# Patient Record
Sex: Female | Born: 1965 | Race: Black or African American | Hispanic: No | Marital: Married | Smoking: Never smoker
Health system: Southern US, Community
[De-identification: ages and names within clinical notes are randomized; demographics above are authoritative.]

---

## 2002-10-12 ENCOUNTER — Encounter (INDEPENDENT_AMBULATORY_CARE_PROVIDER_SITE_OTHER): Payer: Self-pay | Admitting: *Deleted

## 2002-10-12 ENCOUNTER — Ambulatory Visit (HOSPITAL_COMMUNITY): Admission: RE | Admit: 2002-10-12 | Discharge: 2002-10-13 | Payer: Self-pay | Admitting: Otolaryngology

## 2009-07-30 ENCOUNTER — Emergency Department (HOSPITAL_BASED_OUTPATIENT_CLINIC_OR_DEPARTMENT_OTHER): Admission: EM | Admit: 2009-07-30 | Discharge: 2009-07-30 | Payer: Self-pay | Admitting: Emergency Medicine

## 2011-02-20 NOTE — Op Note (Signed)
NAME:  Rachel Navarro, Rachel Navarro NO.:  [id number]   MEDICAL RECORD NO.:  [id number]                   PATIENT TYPE:  OIB   LOCATION:  2550                                 FACILITY:  [facility]   PHYSICIAN:  Suzanna Obey, M.D.                    DATE OF BIRTH:  Jan 08, 1966   DATE OF PROCEDURE:  10/12/2002  DATE OF DISCHARGE:                                 OPERATIVE REPORT   PREOPERATIVE DIAGNOSES:  1. Obstructive sleep apnea.  2. Deviated septum.  3. Turbinate hypertrophy.   POSTOPERATIVE DIAGNOSES:  1. Obstructive sleep apnea.  2. Deviated septum.  3. Turbinate hypertrophy.   SURGICAL PROCEDURE:  1. Septoplasty.  2. Submucosal resection of inferior turbinates.  3. Uvulopalatopharyngoplasty.  4. Tonsillectomy.   ANESTHESIA:  General endotracheal tube.   ESTIMATED BLOOD LOSS:  Approximately 10 cubic centimeters.   INDICATIONS:  This is a 45 year old who has had significant obstructive  sleep apnea and sleep apnea symptoms.  She has had some nasal obstruction.  She has had a sleep study which documented this and failed CPAP.  She now  wants surgery.  She was informed of the risks and benefits of the procedure  including bleeding, infection, perforation, change in external appearance of  the nose, chronic crusting and drying, numbness of the teeth, velopharyngeal  insufficiency, change in the voice, chronic pain, and risk of the  anesthetic.  All questions were answered and consent was obtained.   OPERATION:  The patient was taken to the operating room and placed in the  supine position and padded.  General endotracheal tube was placed in the  supine position.  Prepped and draped in the usual sterile manner.  Oxymetazoline pledgets were placed into the nose bilaterally and then the  septum was injected with 1% lidocaine with 1:100,000 epinephrine as well as  the inferior turbinates.  A right hemitransfixion incision was performed,  raising a  mucoperichondrial and ostial flap.  The cartilage was divided  about 1 cm posterior to the caudal strut and the posterior aspect of the  cartilage was removed with a Therapist, nutritional.  The opposite flap was  elevated.  Jansen-Middleton forceps were used to remove the deviated portion  of the bone, and the inferior spur was removed with a 4-mm osteotome.  This  corrected the central deflection.  Inferior turbinates were in-fractured and  midline incision made with a 15 blade.  Mucosal flap elevated superiorly,  and the inferior mucosa and bone were removed with the turbinate scissors.  Edges cauterized with suction cautery.  The flap was laid back down over the  raw surface and both turbinates were out-fractured.  Telfa roll soaked in  bacitracin was then placed into the nose after closing the incision with a 4-  0 chromic and a 4-0 plain-gut quilting stitch through the septum.  The  packing was secured with  3-0 nylon.  The table was then turned, and Jonna Coup  mouthgag was inserted, retracted and suspended from the Mayo stand.  The  incision was made just above the uvula and carried into the tonsillary fossa  on the left side.  The tonsil capsule was identified and removed with  electrocautery dissection.  The dissection was carried across the palate,  removing the uvula and a small portion of the palate into the right  tonsillar fossa, where the tonsil was removed again with electrocautery  dissection.  The tonsils and uvula were all removed in one en bloc piece.  Suction cautery was used obtain hemostasis.  The tonsillar fossae and palate  were closed with interrupted 3-0 Vicryl.  The Jonna Coup was released and re-  suspended and hemostasis was present in all locations.  The area was  irrigated.  The patient was then awakened and brought to recovery in stable  condition.  Counts were correct.                                               Suzanna Obey, M.D.    [name]  D:  10/12/2002  T:   10/12/2002  Job:  [id number]

## 2016-05-20 ENCOUNTER — Encounter (HOSPITAL_BASED_OUTPATIENT_CLINIC_OR_DEPARTMENT_OTHER): Payer: Self-pay | Admitting: Emergency Medicine

## 2016-05-20 ENCOUNTER — Emergency Department (HOSPITAL_BASED_OUTPATIENT_CLINIC_OR_DEPARTMENT_OTHER)
Admission: EM | Admit: 2016-05-20 | Discharge: 2016-05-20 | Disposition: A | Discharge status: Home / Self Care | Payer: Self-pay | Attending: Emergency Medicine | Admitting: Emergency Medicine

## 2016-05-20 DIAGNOSIS — S80862A Insect bite (nonvenomous), left lower leg, initial encounter: Secondary | ICD-10-CM

## 2016-05-20 DIAGNOSIS — Z23 Encounter for immunization: Secondary | ICD-10-CM | POA: Insufficient documentation

## 2016-05-20 DIAGNOSIS — Y929 Unspecified place or not applicable: Secondary | ICD-10-CM | POA: Insufficient documentation

## 2016-05-20 DIAGNOSIS — W57XXXA Bitten or stung by nonvenomous insect and other nonvenomous arthropods, initial encounter: Secondary | ICD-10-CM | POA: Insufficient documentation

## 2016-05-20 DIAGNOSIS — Y939 Activity, unspecified: Secondary | ICD-10-CM | POA: Insufficient documentation

## 2016-05-20 DIAGNOSIS — S70362A Insect bite (nonvenomous), left thigh, initial encounter: Secondary | ICD-10-CM | POA: Insufficient documentation

## 2016-05-20 DIAGNOSIS — Y999 Unspecified external cause status: Secondary | ICD-10-CM | POA: Insufficient documentation

## 2016-05-20 MED ORDER — TETANUS-DIPHTH-ACELL PERTUSSIS 5-2.5-18.5 LF-MCG/0.5 IM SUSP
0.5000 mL | Freq: Once | INTRAMUSCULAR | Status: AC
  Administered 2016-05-20: 0.5 mL via INTRAMUSCULAR
  Filled 2016-05-20: qty 0.5

## 2016-05-20 NOTE — ED Notes (Signed)
Pt informed of shot time of 1245, instructed to stay until then and inform staff if she begins to feel unusual, otherwise may depart.

## 2016-05-20 NOTE — ED Triage Notes (Signed)
Pt in c/o insect bites to L upper leg, noticed them last week but they are still present, painful, and itching. Pt alert, interactive, ambulatory in NAD.

## 2016-05-21 NOTE — ED Provider Notes (Signed)
[facility]   CSN: [id number] Arrival date & time: 05/20/16  1114     History   Chief Complaint Chief Complaint  Patient presents with  . Leg Pain  . Insect Bite    HPI Rachel Navarro is a 50 y.o. female.  HPI   1 week ago noted likely insect bites of left leg. No sure exactly when it occurred, did not feel moment but noted them later.  Saturday began to develop itching around the area, and began to have shooting pains around the left thigh.  No back pain. No fevers. No similar rash.  Has tried calamine lotion, etoh swabs, tylenol and ibuprofen.   History reviewed. No pertinent past medical history.  There are no active problems to display for this patient.   History reviewed. No pertinent surgical history.  OB History    No data available       Home Medications    Prior to Admission medications   Not on File    Family History History reviewed. No pertinent family history.  Social History Social History  Substance Use Topics  . Smoking status: Never Smoker  . Smokeless tobacco: Not on file  . Alcohol use No     Allergies   Review of patient's allergies indicates no known allergies.   Review of Systems Review of Systems  Constitutional: Negative for fever.  HENT: Negative for sore throat.   Eyes: Negative for visual disturbance.  Respiratory: Negative for cough and shortness of breath.   Cardiovascular: Negative for chest pain.  Gastrointestinal: Negative for abdominal pain.  Genitourinary: Negative for difficulty urinating.  Musculoskeletal: Negative for back pain and neck pain.  Skin: Positive for rash.  Neurological: Negative for syncope and headaches.     Physical Exam Updated Vital Signs BP 130/79 (BP Location: Right Arm)   Pulse 65   Temp 98.2 F (36.8 C) (Oral)   Resp 18   Ht 5\' 9"  (1.753 m)   Wt 290 lb (131.5 kg)   LMP 05/01/2016 (Exact Date)   SpO2 97%   BMI 42.83 kg/m   Physical Exam    Constitutional: She is oriented to person, place, and time. She appears well-developed and well-nourished. No distress.  HENT:  Head: Normocephalic and atraumatic.  Eyes: Conjunctivae and EOM are normal.  Neck: Normal range of motion.  Cardiovascular: Normal rate, regular rhythm, normal heart sounds and intact distal pulses.  Exam reveals no gallop and no friction rub.   No murmur heard. Pulmonary/Chest: Effort normal and breath sounds normal. No respiratory distress. She has no wheezes. She has no rales.  Abdominal: Soft. She exhibits no distension. There is no tenderness. There is no guarding.  Musculoskeletal: She exhibits no edema or tenderness.  Neurological: She is alert and oriented to person, place, and time.  Skin: Skin is warm and dry. Rash (erythematous papules in group on left lateral thigh) noted. She is not diaphoretic. No erythema.  Nursing note and vitals reviewed.    ED Treatments / Results  Labs (all labs ordered are listed, but only abnormal results are displayed) Labs Reviewed - No data to display  EKG  EKG Interpretation None       Radiology No results found.  Procedures Procedures (including critical care time)  Medications Ordered in ED Medications  Tdap (BOOSTRIX) injection 0.5 mL (0.5 mLs Intramuscular Given 05/20/16 1228)     Initial Impression / Assessment and Plan / ED Course  I have reviewed the triage  vital signs and the nursing notes.  Pertinent labs & imaging results that were available during my care of the patient were reviewed by me and considered in my medical decision making (see chart for details).  Clinical Course   50yo female with no significant medical history presents with concern for insect bites to left lower extremity.  Pt with erythematous papules consistent with likely insect bites, no sign of cellulitis or abscess.  Pt reports pain in muscle, however hx/timeline not consistent with black widow bite, and doubt muscular  infxn.  Discussed supportive care. Updated tetanus.  Recommend PCP follow up.   Final Clinical Impressions(s) / ED Diagnoses   Final diagnoses:  Insect bite of lower extremity, left, initial encounter    New Prescriptions There are no discharge medications for this patient.    Alvira MondayErin Quindarius Cabello, MD 05/21/16 [phone]

## 2019-12-11 ENCOUNTER — Ambulatory Visit: Payer: Self-pay | Attending: Internal Medicine

## 2019-12-11 DIAGNOSIS — Z23 Encounter for immunization: Secondary | ICD-10-CM | POA: Insufficient documentation

## 2019-12-11 NOTE — Progress Notes (Signed)
   Covid-19 Vaccination Clinic  Name:  Rachel Navarro    MRN: [id number] DOB: 1966/02/16  12/11/2019  Rachel Navarro was observed post Covid-19 immunization for 15 minutes without incident. She was provided with Vaccine Information Sheet and instruction to access the V-Safe system.   Rachel Navarro was instructed to call 911 with any severe reactions post vaccine: Marland Kitchen Difficulty breathing  . Swelling of face and throat  . A fast heartbeat  . A bad rash all over body  . Dizziness and weakness   Immunizations Administered    Name Date Dose VIS Date Route   Pfizer COVID-19 Vaccine 12/11/2019 11:49 AM 0.3 mL 09/15/2019 Intramuscular   Manufacturer: [employer], [employer]   Lot: [id number]   NDC: [address]-8

## 2020-01-10 ENCOUNTER — Ambulatory Visit: Payer: Self-pay | Attending: Internal Medicine

## 2020-01-10 DIAGNOSIS — Z23 Encounter for immunization: Secondary | ICD-10-CM

## 2020-01-10 NOTE — Progress Notes (Signed)
   Covid-19 Vaccination Clinic  Name:  Rachel Navarro    MRN: [id number] DOB: Sep 18, 1966  01/10/2020  [name] was observed post Covid-19 immunization for 15 minutes without incident. She was provided with Vaccine Information Sheet and instruction to access the V-Safe system.   [name] was instructed to call 911 with any severe reactions post vaccine: Marland Kitchen Difficulty breathing  . Swelling of face and throat  . A fast heartbeat  . A bad rash all over body  . Dizziness and weakness   Immunizations Administered    Name Date Dose VIS Date Route   Pfizer COVID-19 Vaccine 01/10/2020 11:54 AM 0.3 mL 09/15/2019 Intramuscular   Manufacturer: [employer], [employer]   Lot: [id number]   NDC: [address]-2      Covid-19 Vaccination Clinic  Name:  Rachel Navarro    MRN: [id number] DOB: 03/21/1966  01/10/2020  [name] was observed post Covid-19 immunization for 15 minutes without incident. She was provided with Vaccine Information Sheet and instruction to access the V-Safe system.   [name] was instructed to call 911 with any severe reactions post vaccine: Marland Kitchen Difficulty breathing  . Swelling of face and throat  . A fast heartbeat  . A bad rash all over body  . Dizziness and weakness   Immunizations Administered    Name Date Dose VIS Date Route   Pfizer COVID-19 Vaccine 01/10/2020 11:54 AM 0.3 mL 09/15/2019 Intramuscular   Manufacturer: [employer], [employer]   Lot: [id number]   NDC: [address]-2

## 2020-05-28 ENCOUNTER — Other Ambulatory Visit: Payer: Self-pay

## 2020-05-28 ENCOUNTER — Encounter (HOSPITAL_BASED_OUTPATIENT_CLINIC_OR_DEPARTMENT_OTHER): Payer: Self-pay | Admitting: Emergency Medicine

## 2020-05-28 ENCOUNTER — Emergency Department (HOSPITAL_BASED_OUTPATIENT_CLINIC_OR_DEPARTMENT_OTHER)
Admission: EM | Admit: 2020-05-28 | Discharge: 2020-05-28 | Disposition: A | Discharge status: Home / Self Care | Payer: Self-pay | Attending: Emergency Medicine | Admitting: Emergency Medicine

## 2020-05-28 ENCOUNTER — Emergency Department (HOSPITAL_BASED_OUTPATIENT_CLINIC_OR_DEPARTMENT_OTHER): Payer: Self-pay

## 2020-05-28 DIAGNOSIS — N200 Calculus of kidney: Secondary | ICD-10-CM

## 2020-05-28 DIAGNOSIS — N132 Hydronephrosis with renal and ureteral calculous obstruction: Secondary | ICD-10-CM | POA: Insufficient documentation

## 2020-05-28 LAB — CBC WITH DIFFERENTIAL/PLATELET
Abs Immature Granulocytes: 0.05 10*3/uL (ref 0.00–0.07)
Basophils Absolute: 0.1 10*3/uL (ref 0.0–0.1)
Basophils Relative: 1 %
Eosinophils Absolute: 0.1 10*3/uL (ref 0.0–0.5)
Eosinophils Relative: 1 %
HCT: 42.4 % (ref 36.0–46.0)
Hemoglobin: 13.5 g/dL (ref 12.0–15.0)
Immature Granulocytes: 1 %
Lymphocytes Relative: 23 %
Lymphs Abs: 2.3 10*3/uL (ref 0.7–4.0)
MCH: 27.3 pg (ref 26.0–34.0)
MCHC: 31.8 g/dL (ref 30.0–36.0)
MCV: 85.7 fL (ref 80.0–100.0)
Monocytes Absolute: 0.5 10*3/uL (ref 0.1–1.0)
Monocytes Relative: 5 %
Neutro Abs: 6.9 10*3/uL (ref 1.7–7.7)
Neutrophils Relative %: 69 %
Platelets: 260 10*3/uL (ref 150–400)
RBC: 4.95 MIL/uL (ref 3.87–5.11)
RDW: 15.5 % (ref 11.5–15.5)
WBC: 9.9 10*3/uL (ref 4.0–10.5)
nRBC: 0 % (ref 0.0–0.2)

## 2020-05-28 LAB — URINALYSIS, MICROSCOPIC (REFLEX)

## 2020-05-28 LAB — COMPREHENSIVE METABOLIC PANEL
ALT: 14 U/L (ref 0–44)
AST: 18 U/L (ref 15–41)
Albumin: 4 g/dL (ref 3.5–5.0)
Alkaline Phosphatase: 73 U/L (ref 38–126)
Anion gap: 9 (ref 5–15)
BUN: 15 mg/dL (ref 6–20)
CO2: 25 mmol/L (ref 22–32)
Calcium: 8.8 mg/dL — ABNORMAL LOW (ref 8.9–10.3)
Chloride: 106 mmol/L (ref 98–111)
Creatinine, Ser: 0.85 mg/dL (ref 0.44–1.00)
GFR calc Af Amer: >60 mL/min (ref >60)
GFR calc non Af Amer: >60 mL/min (ref >60)
Glucose, Bld: 154 mg/dL — ABNORMAL HIGH (ref 70–99)
Potassium: 3.5 mmol/L (ref 3.5–5.1)
Sodium: 140 mmol/L (ref 135–145)
Total Bilirubin: 0.5 mg/dL (ref 0.3–1.2)
Total Protein: 7.5 g/dL (ref 6.5–8.1)

## 2020-05-28 LAB — URINALYSIS, ROUTINE W REFLEX MICROSCOPIC
Bilirubin Urine: NEGATIVE
Glucose, UA: NEGATIVE mg/dL
Ketones, ur: NEGATIVE mg/dL
Leukocytes,Ua: NEGATIVE
Nitrite: NEGATIVE
Protein, ur: NEGATIVE mg/dL
Specific Gravity, Urine: >1.03 — ABNORMAL HIGH (ref 1.005–1.030)
pH: 6 (ref 5.0–8.0)

## 2020-05-28 LAB — LIPASE, BLOOD: Lipase: 31 U/L (ref 11–51)

## 2020-05-28 MED ORDER — HYDROCODONE-ACETAMINOPHEN 7.5-325 MG/15ML PO SOLN: ORAL | 0 refills | Status: AC

## 2020-05-28 MED ORDER — HYDROCODONE-ACETAMINOPHEN 7.5-325 MG/15ML PO SOLN: ORAL | 0 refills | Status: DC

## 2020-05-28 MED ORDER — SODIUM CHLORIDE 0.9 % IV BOLUS
1000.0000 mL | Freq: Once | INTRAVENOUS | Status: AC
  Administered 2020-05-28: 1000 mL via INTRAVENOUS

## 2020-05-28 MED ORDER — MORPHINE SULFATE (PF) 4 MG/ML IV SOLN
4.0000 mg | Freq: Once | INTRAVENOUS | Status: AC
  Administered 2020-05-28: 4 mg via INTRAVENOUS
  Filled 2020-05-28: qty 1

## 2020-05-28 MED ORDER — KETOROLAC TROMETHAMINE 30 MG/ML IJ SOLN
30.0000 mg | Freq: Once | INTRAMUSCULAR | Status: AC
  Administered 2020-05-28: 30 mg via INTRAVENOUS
  Filled 2020-05-28: qty 1

## 2020-05-28 MED ORDER — ONDANSETRON HCL 4 MG/2ML IJ SOLN
INTRAMUSCULAR | Status: AC
  Administered 2020-05-28: 4 mg via INTRAVENOUS
  Filled 2020-05-28: qty 2

## 2020-05-28 MED ORDER — ONDANSETRON HCL 4 MG/2ML IJ SOLN
4.0000 mg | Freq: Once | INTRAMUSCULAR | Status: AC
Start: 2020-05-28 — End: 2020-05-28

## 2020-05-28 MED ORDER — ONDANSETRON HCL 4 MG/2ML IJ SOLN: 4.0000 mg | Freq: Once | INTRAMUSCULAR | Status: DC

## 2020-05-28 NOTE — ED Triage Notes (Signed)
Pt reports right sided abd pain with nausea starting at 0200

## 2020-05-28 NOTE — Discharge Instructions (Signed)
Begin taking Hycet solution as prescribed as needed for pain.  Follow-up with urology if the stone has not passed in the next 3 to 4 days.  The contact information for [facility] has been provided in this discharge summary for you to call and make these arrangements.  Return to the emergency department if you develop worsening pain, high fever, or other new and concerning symptoms.

## 2020-05-28 NOTE — ED Provider Notes (Signed)
[facility] EMERGENCY DEPARTMENT Provider Note   CSN: [id number] Arrival date & time: 05/28/20  0501     History Chief Complaint  Patient presents with  . Abdominal Pain    Rachel Navarro is a 54 y.o. female.  Patient is a 54 year old female with no significant past medical history.  She presents today for evaluation of abdominal pain.  Patient was awakened from sleep at approximately 2 AM with pain to the right side of her abdomen.  This came on suddenly and was associated with nausea and vomiting.  She denies any diarrhea or bloody stools.  She denies any urinary complaints.  The history is provided by the patient.  Abdominal Pain Pain location:  RUQ and RLQ Pain quality: stabbing   Pain radiates to:  Does not radiate Pain severity:  Moderate Onset quality:  Sudden Duration:  3 hours Timing:  Constant Progression:  Unchanged Chronicity:  New Relieved by:  Nothing Worsened by:  Nothing      No past medical history on file.  There are no problems to display for this patient.   No past surgical history on file.   OB History   No obstetric history on file.     No family history on file.  Social History   Tobacco Use  . Smoking status: Never Smoker  Substance Use Topics  . Alcohol use: No  . Drug use: Not on file    Home Medications Prior to Admission medications   Not on File    Allergies    Patient has no known allergies.  Review of Systems   Review of Systems  Gastrointestinal: Positive for abdominal pain.  All other systems reviewed and are negative.   Physical Exam Updated Vital Signs BP (!) 159/85   Pulse 70   Temp 98.5 F (36.9 C) (Oral)   Resp 20   Ht 5\' 9"  (1.753 m)   Wt 127 kg   LMP 05/01/2016 (Exact Date)   SpO2 96%   BMI 41.35 kg/m   Physical Exam Vitals and nursing note reviewed.  Constitutional:      General: She is not in acute distress.    Appearance: She is well-developed. She is not diaphoretic.  HENT:      Head: Normocephalic and atraumatic.  Cardiovascular:     Rate and Rhythm: Normal rate and regular rhythm.     Heart sounds: No murmur heard.  No friction rub. No gallop.   Pulmonary:     Effort: Pulmonary effort is normal. No respiratory distress.     Breath sounds: Normal breath sounds. No wheezing.  Abdominal:     General: Bowel sounds are normal. There is no distension.     Palpations: Abdomen is soft.     Tenderness: There is abdominal tenderness in the right upper quadrant and right lower quadrant. There is no right CVA tenderness, left CVA tenderness, guarding or rebound.  Musculoskeletal:        General: Normal range of motion.     Cervical back: Normal range of motion and neck supple.  Skin:    General: Skin is warm and dry.  Neurological:     Mental Status: She is alert and oriented to person, place, and time.     ED Results / Procedures / Treatments   Labs (all labs ordered are listed, but only abnormal results are displayed) Labs Reviewed  URINALYSIS, ROUTINE W REFLEX MICROSCOPIC  COMPREHENSIVE METABOLIC PANEL  LIPASE, BLOOD  CBC WITH DIFFERENTIAL/PLATELET  EKG None  Radiology No results found.  Procedures Procedures (including critical care time)  Medications Ordered in ED Medications  sodium chloride 0.9 % bolus 1,000 mL (has no administration in time range)  ondansetron (ZOFRAN) injection 4 mg (has no administration in time range)  morphine 4 MG/ML injection 4 mg (has no administration in time range)  ondansetron (ZOFRAN) injection 4 mg (4 mg Intravenous Given 05/28/20 0523)    ED Course  I have reviewed the triage vital signs and the nursing notes.  Pertinent labs & imaging results that were available during my care of the patient were reviewed by me and considered in my medical decision making (see chart for details).    MDM Rules/Calculators/A&P  Patient presenting with complaints of right sided abdominal pain that started acutely at 2  AM while the patient was asleep.  Patient's vital signs and laboratory studies are unremarkable.  She does have moderate hemoglobin in her urinalysis.  CT scan shows a 3 mm calculus at the right UVJ with hydronephrosis.  Patient given pain medication here in the ER, but appears appropriate for discharge.  She is to follow-up as needed if the stone does not pass in the next few days.  Final Clinical Impression(s) / ED Diagnoses Final diagnoses:  None    Rx / DC Orders ED Discharge Orders    None       Geoffery Lyons, MD 05/28/20 [phone]

## 2020-06-04 ENCOUNTER — Ambulatory Visit: Payer: Self-pay | Attending: Critical Care Medicine

## 2020-06-04 DIAGNOSIS — Z23 Encounter for immunization: Secondary | ICD-10-CM

## 2020-06-04 NOTE — Progress Notes (Signed)
   Covid-19 Vaccination Clinic  Name:  Kaoru Benda    MRN: [id number] DOB: [birth year]  06/04/2020  Ms. Tregre was observed post Covid-19 immunization for 15 minutes without incident. She was provided with Vaccine Information Sheet and instruction to access the V-Safe system.   Ms. Tapia was instructed to call 911 with any severe reactions post vaccine: Marland Kitchen Difficulty breathing  . Swelling of face and throat  . A fast heartbeat  . A bad rash all over body  . Dizziness and weakness

## 2022-02-14 IMAGING — CT CT RENAL STONE PROTOCOL
2 of 4 series · 16 of 46 positions shown, 18 images · non-contrast
Comparison: None.

CLINICAL DATA: Flank pain.  Evaluate for kidney stone.

EXAM:
CT ABDOMEN AND PELVIS WITHOUT CONTRAST
TECHNIQUE: Multidetector CT imaging of the abdomen and pelvis was performed
following the standard protocol without IV contrast.

[Series 2: axial st · axial · 0.83mm/px · z∈[+863,+1303]mm · 13 of 97 slices shown, 15 images]
[im 5/97  soft-tissue]
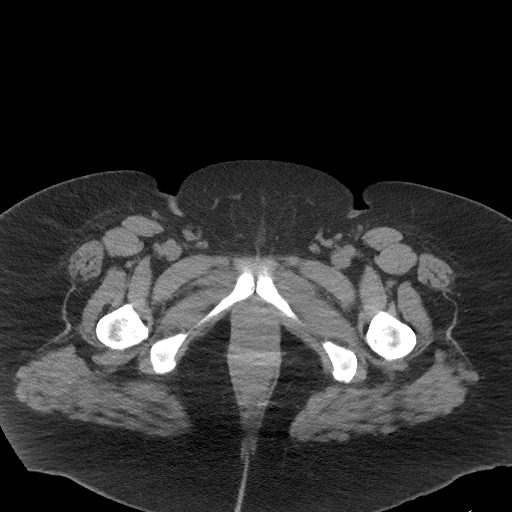
[im 5/97  bone]
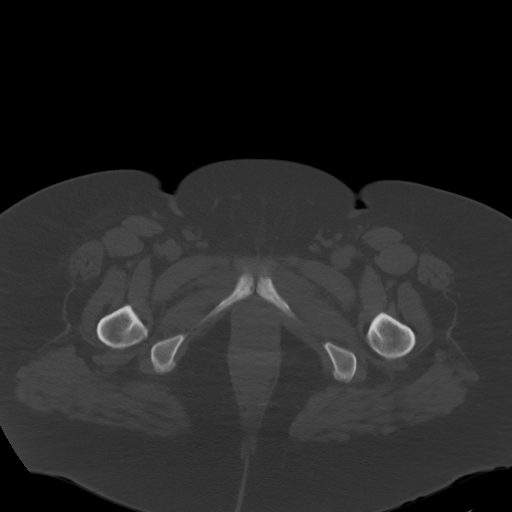
[im 13/97  soft-tissue]
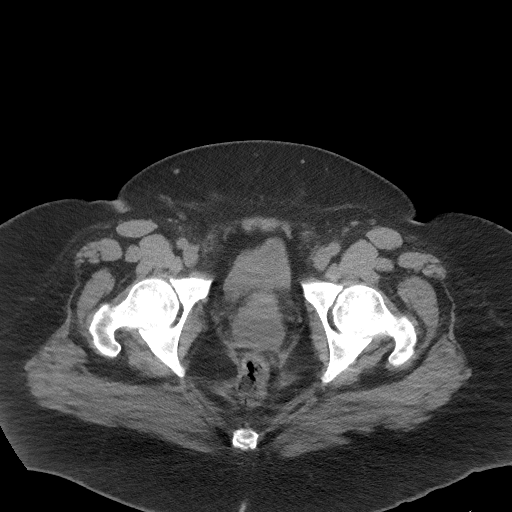
[im 21/97  soft-tissue]
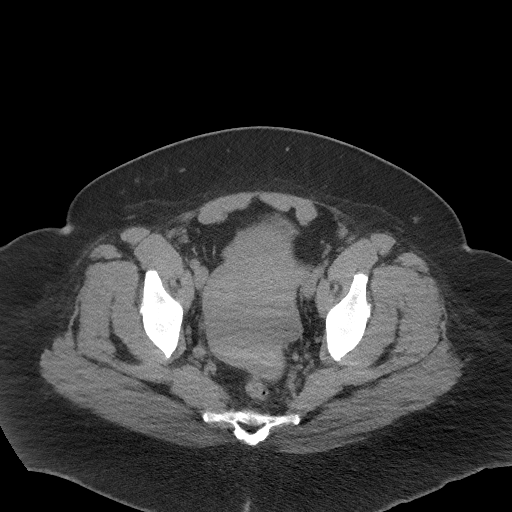
[im 29/97  soft-tissue]
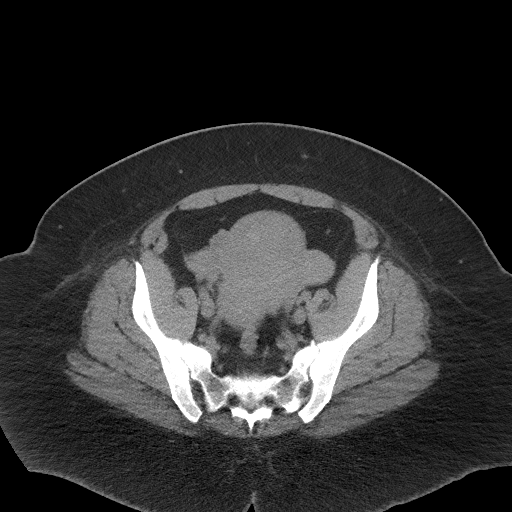
[im 33/97  soft-tissue]
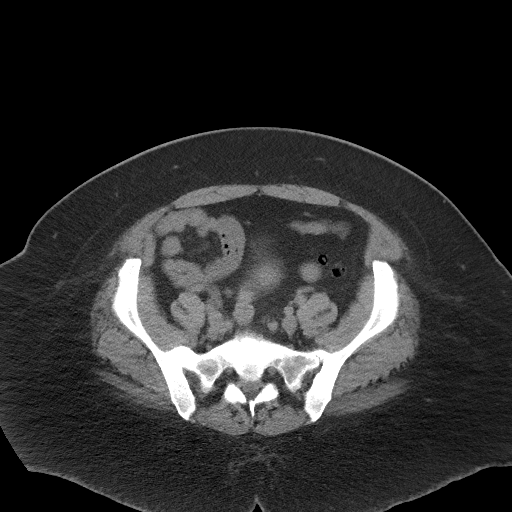
[im 41/97  soft-tissue]
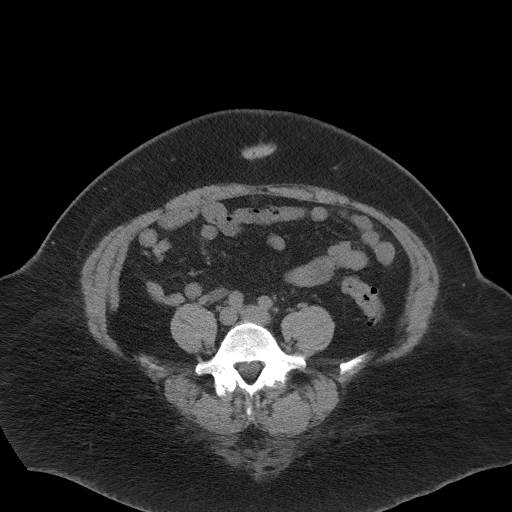
[im 49/97  soft-tissue]
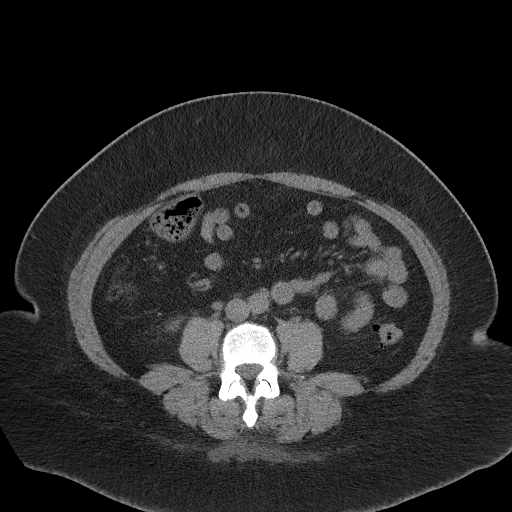
[im 57/97  soft-tissue]
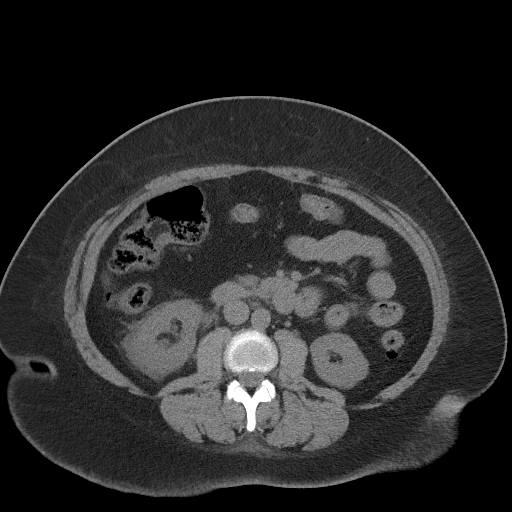
[im 65/97  soft-tissue]
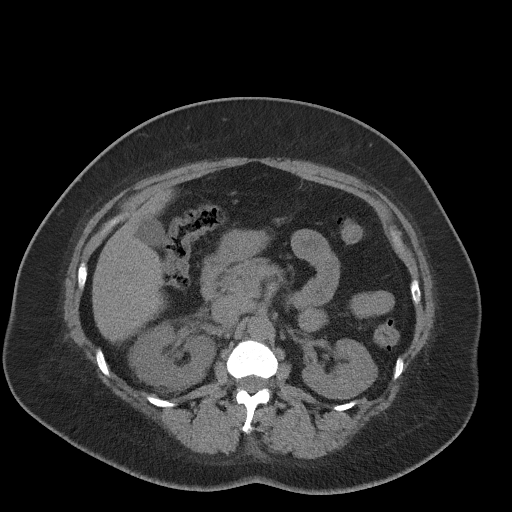
[im 65/97  bone]
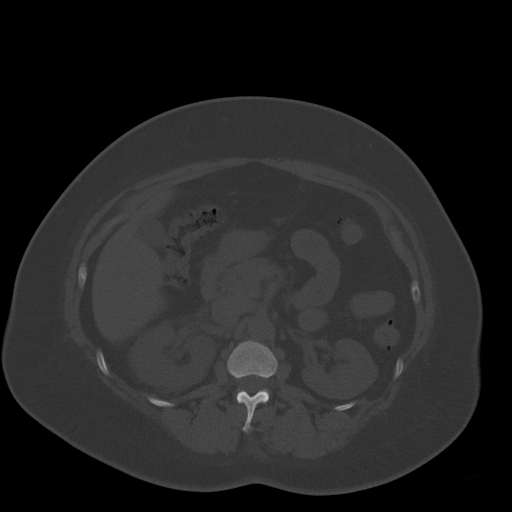
[im 69/97  soft-tissue]
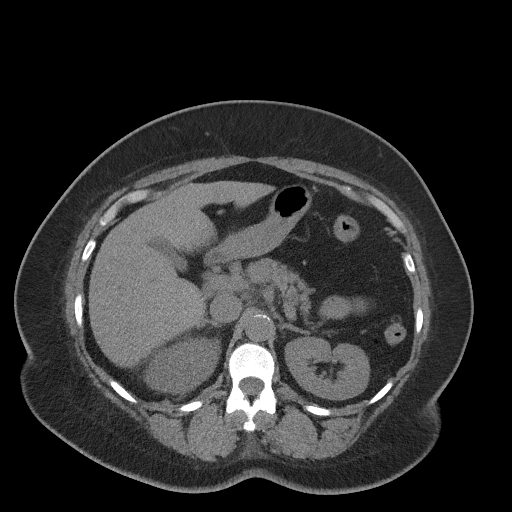
[im 77/97  soft-tissue]
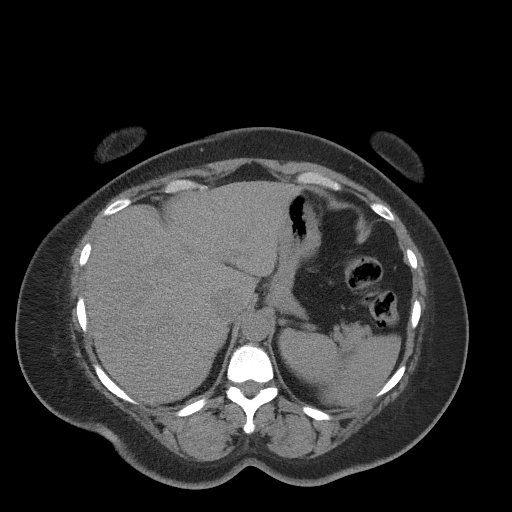
[im 85/97  soft-tissue]
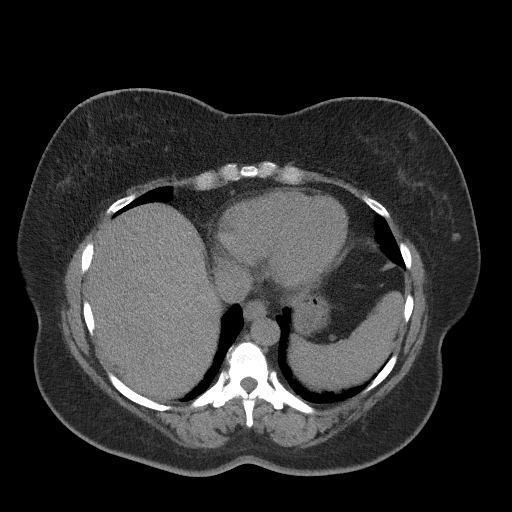
[im 93/97  soft-tissue]
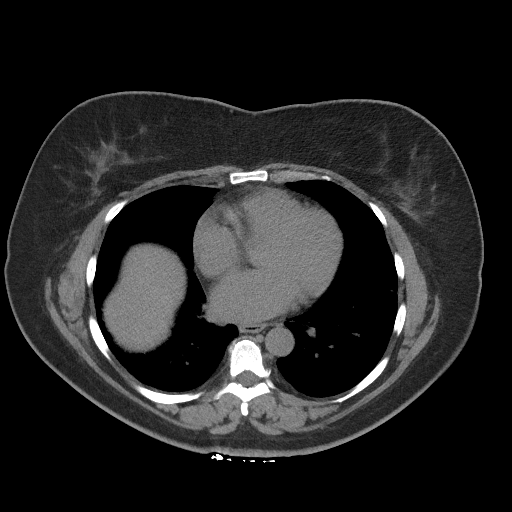

[Series 5: coronal st · coronal · 0.87mm/px · 3 of 113 slices shown]
[im 38/113  soft-tissue]
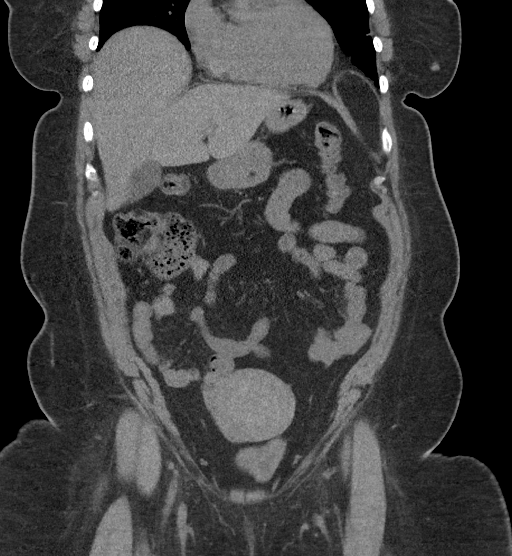
[im 50/113  soft-tissue]
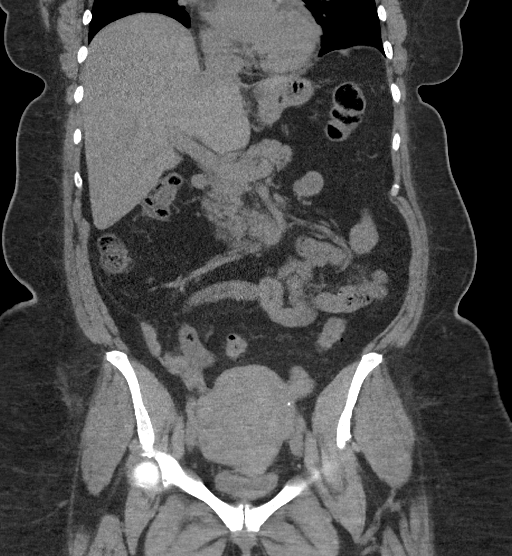
[im 63/113  soft-tissue]
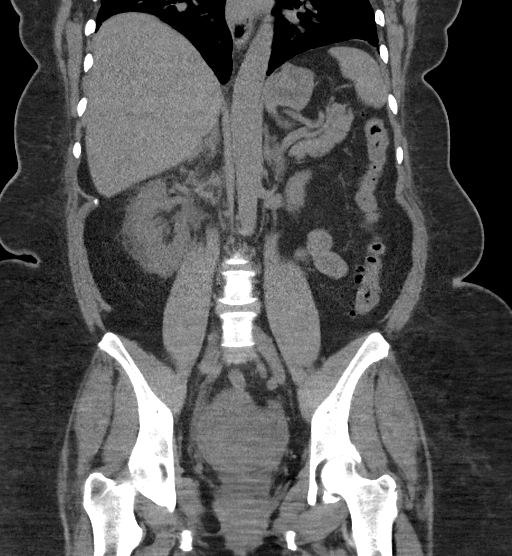

[16 of 46 positions shown; findings below may reference images not displayed]

FINDINGS: Lower chest: There are several small subpleural nodules identified
within the lingula and both lower lobes. The largest is in the
posterolateral left lower lobe with an equivalent diameter of 7 mm,
image [DATE]. No acute abnormality identified.

Hepatobiliary: No focal liver abnormality is seen. No gallstones,
gallbladder wall thickening, or biliary dilatation.

Pancreas: Unremarkable. No pancreatic ductal dilatation or
surrounding inflammatory changes.

Spleen: Normal in size without focal abnormality.

Adrenals/Urinary Tract: Normal appearance of the adrenal glands. The
left kidney is unremarkable. No kidney stone or hydronephrosis
identified. There is right-sided perinephric fat stranding and mild
nephromegaly. Hydronephrosis is identified with periureteral soft
tissue stranding. Stone within the distal right ureter measures 3
mm, image 79/2. Urinary bladder is unremarkable.

Stomach/Bowel: Stomach is within normal limits. Appendix appears
normal. No evidence of bowel wall thickening, distention, or
inflammatory changes. Distal colonic diverticula identified without
signs of diverticulitis.

Vascular/Lymphatic: Aortic atherosclerosis. No enlarged abdominal or
pelvic lymph nodes.

Reproductive: Enlarged fibroid uterus identified. 3.3 cm exophytic
fibroid arises from the left side of the uterine fundus. No adnexal
masses identified.

Other: No free fluid or fluid collections.

Musculoskeletal: No acute or significant osseous findings.
IMPRESSION: 1. Right-sided hydronephrosis and hydroureter secondary to 3 mm
distal right ureteral calculus.
2. Enlarged fibroid uterus.
3. Several small subpleural nodules identified within the lingula
and both lower lobes. The largest measures 7 mm with an equivalent
diameter of 7 mm. Recommend follow-up imaging with nonemergent CT of
the chest.

Aortic Atherosclerosis (QX9LW-S23.3).
